# Patient Record
Sex: Male | Born: 1980 | Hispanic: No | Marital: Married | Smoking: Current every day smoker
Health system: Southern US, Community
[De-identification: ages and names within clinical notes are randomized; demographics above are authoritative.]

## PROBLEM LIST (undated history)

## (undated) DIAGNOSIS — G43909 Migraine, unspecified, not intractable, without status migrainosus: Secondary | ICD-10-CM

## (undated) DIAGNOSIS — T7840XA Allergy, unspecified, initial encounter: Secondary | ICD-10-CM

## (undated) DIAGNOSIS — F32A Depression, unspecified: Secondary | ICD-10-CM

## (undated) DIAGNOSIS — F329 Major depressive disorder, single episode, unspecified: Secondary | ICD-10-CM

## (undated) DIAGNOSIS — B019 Varicella without complication: Secondary | ICD-10-CM

## (undated) DIAGNOSIS — M109 Gout, unspecified: Secondary | ICD-10-CM

## (undated) DIAGNOSIS — F909 Attention-deficit hyperactivity disorder, unspecified type: Secondary | ICD-10-CM

## (undated) DIAGNOSIS — J45909 Unspecified asthma, uncomplicated: Secondary | ICD-10-CM

## (undated) HISTORY — DX: Attention-deficit hyperactivity disorder, unspecified type: F90.9

## (undated) HISTORY — DX: Gout, unspecified: M10.9

## (undated) HISTORY — DX: Depression, unspecified: F32.A

## (undated) HISTORY — DX: Major depressive disorder, single episode, unspecified: F32.9

## (undated) HISTORY — DX: Unspecified asthma, uncomplicated: J45.909

## (undated) HISTORY — DX: Varicella without complication: B01.9

## (undated) HISTORY — PX: WRIST SURGERY: SHX841

## (undated) HISTORY — DX: Allergy, unspecified, initial encounter: T78.40XA

## (undated) HISTORY — DX: Migraine, unspecified, not intractable, without status migrainosus: G43.909

---

## 2014-11-09 ENCOUNTER — Ambulatory Visit (INDEPENDENT_AMBULATORY_CARE_PROVIDER_SITE_OTHER): Payer: Self-pay | Admitting: Family

## 2014-11-09 ENCOUNTER — Ambulatory Visit (INDEPENDENT_AMBULATORY_CARE_PROVIDER_SITE_OTHER)
Admission: RE | Admit: 2014-11-09 | Discharge: 2014-11-09 | Disposition: A | Discharge status: Home / Self Care | Payer: Self-pay | Source: Ambulatory Visit | Attending: Family | Admitting: Family

## 2014-11-09 ENCOUNTER — Telehealth: Payer: Self-pay | Admitting: Family

## 2014-11-09 ENCOUNTER — Encounter: Payer: Self-pay | Admitting: Family

## 2014-11-09 VITALS — BP 162/96 | HR 106 | Temp 98.6°F | Resp 18 | Ht 72.0 in | Wt 217.0 lb

## 2014-11-09 DIAGNOSIS — M25571 Pain in right ankle and joints of right foot: Secondary | ICD-10-CM

## 2014-11-09 DIAGNOSIS — F32A Depression, unspecified: Secondary | ICD-10-CM

## 2014-11-09 DIAGNOSIS — M109 Gout, unspecified: Secondary | ICD-10-CM

## 2014-11-09 DIAGNOSIS — F411 Generalized anxiety disorder: Secondary | ICD-10-CM

## 2014-11-09 DIAGNOSIS — J45901 Unspecified asthma with (acute) exacerbation: Secondary | ICD-10-CM | POA: Insufficient documentation

## 2014-11-09 DIAGNOSIS — J4531 Mild persistent asthma with (acute) exacerbation: Secondary | ICD-10-CM

## 2014-11-09 DIAGNOSIS — H109 Unspecified conjunctivitis: Secondary | ICD-10-CM

## 2014-11-09 DIAGNOSIS — F329 Major depressive disorder, single episode, unspecified: Secondary | ICD-10-CM

## 2014-11-09 MED ORDER — ALBUTEROL SULFATE HFA 108 (90 BASE) MCG/ACT IN AERS: 1.0000 | INHALATION_SPRAY | Freq: Four times a day (QID) | RESPIRATORY_TRACT | Status: DC | PRN

## 2014-11-09 MED ORDER — ALPRAZOLAM 1 MG PO TABS: 1.0000 mg | ORAL_TABLET | Freq: Three times a day (TID) | ORAL | Status: DC | PRN

## 2014-11-09 MED ORDER — FUROSEMIDE 20 MG PO TABS: 20.0000 mg | ORAL_TABLET | Freq: Every day | ORAL | Status: DC | PRN

## 2014-11-09 MED ORDER — ESCITALOPRAM OXALATE 10 MG PO TABS: 10.0000 mg | ORAL_TABLET | Freq: Every day | ORAL | Status: DC

## 2014-11-09 MED ORDER — INDOMETHACIN 50 MG PO CAPS: 50.0000 mg | ORAL_CAPSULE | Freq: Three times a day (TID) | ORAL | Status: DC | PRN

## 2014-11-09 MED ORDER — ALLOPURINOL 300 MG PO TABS: 300.0000 mg | ORAL_TABLET | Freq: Two times a day (BID) | ORAL | Status: DC

## 2014-11-09 MED ORDER — OLOPATADINE HCL 0.2 % OP SOLN: 1.0000 [drp] | Freq: Every day | OPHTHALMIC | Status: AC

## 2014-11-09 MED ORDER — METHYLPREDNISOLONE ACETATE 80 MG/ML IJ SUSP
80.0000 mg | Freq: Once | INTRAMUSCULAR | Status: AC
  Administered 2014-11-09: 80 mg via INTRAMUSCULAR

## 2014-11-09 MED ORDER — FLUTICASONE-SALMETEROL 500-50 MCG/DOSE IN AEPB: 1.0000 | INHALATION_SPRAY | Freq: Two times a day (BID) | RESPIRATORY_TRACT | Status: DC

## 2014-11-09 NOTE — Telephone Encounter (Addendum)
Please inform patient that his x-rays show no evidence of fracture in his right ankle, which did not include his foot. I would like him to return just for a foot x-ray at his convenience. In the meantime, please have him continue with ice, compression, elevation and non-weightbearing.

## 2014-11-09 NOTE — Patient Instructions (Signed)
Thank you for choosing [facility].  Summary/Instructions:  Your prescription(s) have been submitted to your pharmacy or been printed and provided for you. Please take as directed and contact our office if you believe you are having problem(s) with the medication(s) or have any questions.  Please stop by radiology on the basement level of the building for your x-rays. Your results will be released to MyChart (or called to you) after review, usually within 72 hours after test completion. If any treatments or changes are necessary, you will be notified at that same time.  If your symptoms worsen or fail to improve, please contact our office for further instruction, or in case of emergency go directly to the emergency room at the closest medical facility.   Allergic Conjunctivitis The conjunctiva is a thin membrane that covers the visible white part of the eyeball and the underside of the eyelids. This membrane protects and lubricates the eye. The membrane has small blood vessels running through it that can normally be seen. When the conjunctiva becomes inflamed, the condition is called conjunctivitis. In response to the inflammation, the conjunctival blood vessels become swollen. The swelling results in redness in the normally white part of the eye. The blood vessels of this membrane also react when a person has allergies and is then called allergic conjunctivitis. This condition usually lasts for as long as the allergy persists. Allergic conjunctivitis cannot be passed to another person (non-contagious). The likelihood of bacterial infection is great and the cause is not likely due to allergies if the inflamed eye has:  A sticky discharge.  Discharge or sticking together of the lids in the morning.  Scaling or flaking of the eyelids where the eyelashes come out.  Red swollen eyelids. CAUSES   Viruses.  Irritants such as foreign bodies.  Chemicals.  General allergic  reactions.  Inflammation or serious diseases in the inside or the outside of the eye or the orbit (the boney cavity in which the eye sits) can cause a "red eye." SYMPTOMS   Eye redness.  Tearing.  Itchy eyes.  Burning feeling in the eyes.  Clear drainage from the eye.  Allergic reaction due to pollens or ragweed sensitivity. Seasonal allergic conjunctivitis is frequent in the spring when pollens are in the air and in the fall. DIAGNOSIS  This condition, in its many forms, is usually diagnosed based on the history and an ophthalmological exam. It usually involves both eyes. If your eyes react at the same time every year, allergies may be the cause. While most "red eyes" are due to allergy or an infection, the role of an eye (ophthalmological) exam is important. The exam can rule out serious diseases of the eye or orbit. TREATMENT   Non-antibiotic eye drops, ointments, or medications by mouth may be prescribed if the ophthalmologist is sure the conjunctivitis is due to allergies alone.  Over-the-counter drops and ointments for allergic symptoms should be used only after other causes of conjunctivitis have been ruled out, or as your caregiver suggests. Medications by mouth are often prescribed if other allergy-related symptoms are present. If the ophthalmologist is sure that the conjunctivitis is due to allergies alone, treatment is normally limited to drops or ointments to reduce itching and burning. HOME CARE INSTRUCTIONS   Wash hands before and after applying drops or ointments, or touching the inflamed eye(s) or eyelids.  Do not let the eye dropper tip or ointment tube touch the eyelid when putting medicine in your eye.  Stop using  your soft contact lenses and throw them away. Use a new pair of lenses when recovery is complete. You should run through sterilizing cycles at least three times before use after complete recovery if the old soft contact lenses are to be used. Hard contact  lenses should be stopped. They need to be thoroughly sterilized before use after recovery.  Itching and burning eyes due to allergies is often relieved by using a cool cloth applied to closed eye(s). SEEK MEDICAL CARE IF:   Your problems do not go away after two or three days of treatment.  Your lids are sticky (especially in the morning when you wake up) or stick together.  Discharge develops. Antibiotics may be needed either as drops, ointment, or by mouth.  You have extreme light sensitivity.  An oral temperature above 102 F (38.9 C) develops.  Pain in or around the eye or any other visual symptom develops. MAKE SURE YOU:   Understand these instructions.  Will watch your condition.  Will get help right away if you are not doing well or get worse. Document Released: 06/17/2002 Document Revised: 06/19/2011 Document Reviewed: 05/13/2007 [facility] Patient Information 2015 [place], [place]. This information is not intended to replace advice given to you by your health care provider. Make sure you discuss any questions you have with your health care provider.  Ankle Sprain An ankle sprain is an injury to the strong, fibrous tissues (ligaments) that hold the bones of your ankle joint together.  CAUSES An ankle sprain is usually caused by a fall or by twisting your ankle. Ankle sprains most commonly occur when you step on the outer edge of your foot, and your ankle turns inward. People who participate in sports are more prone to these types of injuries.  SYMPTOMS   Pain in your ankle. The pain may be present at rest or only when you are trying to stand or walk.  Swelling.  Bruising. Bruising may develop immediately or within 1 to 2 days after your injury.  Difficulty standing or walking, particularly when turning corners or changing directions. DIAGNOSIS  Your caregiver will ask you details about your injury and perform a physical exam of your ankle to determine if you have an  ankle sprain. During the physical exam, your caregiver will press on and apply pressure to specific areas of your foot and ankle. Your caregiver will try to move your ankle in certain ways. An X-ray exam may be done to be sure a bone was not broken or a ligament did not separate from one of the bones in your ankle (avulsion fracture).  TREATMENT  Certain types of braces can help stabilize your ankle. Your caregiver can make a recommendation for this. Your caregiver may recommend the use of medicine for pain. If your sprain is severe, your caregiver may refer you to a surgeon who helps to restore function to parts of your skeletal system (orthopedist) or a physical therapist. HOME CARE INSTRUCTIONS   Apply ice to your injury for 1-2 days or as directed by your caregiver. Applying ice helps to reduce inflammation and pain.  Put ice in a plastic bag.  Place a towel between your skin and the bag.  Leave the ice on for 15-20 minutes at a time, every 2 hours while you are awake.  Only take over-the-counter or prescription medicines for pain, discomfort, or fever as directed by your caregiver.  Elevate your injured ankle above the level of your heart as much as possible for  2-3 days.  If your caregiver recommends crutches, use them as instructed. Gradually put weight on the affected ankle. Continue to use crutches or a cane until you can walk without feeling pain in your ankle.  If you have a plaster splint, wear the splint as directed by your caregiver. Do not rest it on anything harder than a pillow for the first 24 hours. Do not put weight on it. Do not get it wet. You may take it off to take a shower or bath.  You may have been given an elastic bandage to wear around your ankle to provide support. If the elastic bandage is too tight (you have numbness or tingling in your foot or your foot becomes cold and blue), adjust the bandage to make it comfortable.  If you have an air splint, you may blow  more air into it or let air out to make it more comfortable. You may take your splint off at night and before taking a shower or bath. Wiggle your toes in the splint several times per day to decrease swelling. SEEK MEDICAL CARE IF:   You have rapidly increasing bruising or swelling.  Your toes feel extremely cold or you lose feeling in your foot.  Your pain is not relieved with medicine. SEEK IMMEDIATE MEDICAL CARE IF:  Your toes are numb or blue.  You have severe pain that is increasing. MAKE SURE YOU:   Understand these instructions.  Will watch your condition.  Will get help right away if you are not doing well or get worse. Document Released: 03/27/2005 Document Revised: 12/20/2011 Document Reviewed: 04/08/2011 [facility] Patient Information 2015 [place], [place]. This information is not intended to replace advice given to you by your health care provider. Make sure you discuss any questions you have with your health care provider.

## 2014-11-09 NOTE — Progress Notes (Signed)
Subjective:    Patient ID: Andrew Leonard, male    DOB: [birth year], 34 y.o.   MRN: [id number]  Chief Complaint  Patient presents with  . Establish Care    eyes are bothering him, allergies are bothering him, right ankle pain and swollen    HPI:  Geoff Dacanay is a 34 y.o. male with a PMH of seasonal allergies, asthma, depression, gout, ADHD, chickenpox, and migraines who presents today for an office visit to establish care.    1.) Eyes - Associated symptom of eye redness has been going on since this morning. Is allergic to cats and most recently had his mother-in-law staying with them who has brought her cats. Eye discharge is present and described as clear to yellow. Modifying factors include flonase which did not help.  2.) Right ankle - Associated symptom of pain and swelling located in his right ankle has been going on for about 1 day when he was working out in the yard when he fell. Unsure of any sounds or sensations heard or felt. Modifying factors include ice and OTC anti-inflammatories which seemed to help.  Allergies  Allergen Reactions  . Tramadol Hives  . Uloric [Febuxostat] Swelling     No outpatient prescriptions prior to visit.   No facility-administered medications prior to visit.     Past Medical History  Diagnosis Date  . Gout   . Asthma   . Allergy   . Depression   . ADHD (attention deficit hyperactivity disorder)   . Chicken pox   . Migraines      Past Surgical History  Procedure Laterality Date  . Wrist surgery       Family History  Problem Relation Age of Onset  . Arthritis Father   . Esophageal cancer Maternal Grandfather   . Breast cancer Paternal Grandmother   . Colon cancer Paternal Grandfather      History   Social History  . Marital Status: Married    Spouse Name: N/A  . Number of Children: 1  . Years of Education: 14   Occupational History  . Welder    Social History Main Topics  . Smoking status: Current Every  Day Smoker -- 0.50 packs/day  . Smokeless tobacco: Never Used  . Alcohol Use: No  . Drug Use: No  . Sexual Activity: Not on file   Other Topics Concern  . Not on file   Social History Narrative   Wife has MS; allergic to cats    Review of Systems  Constitutional: Negative for fever and chills.  Eyes: Positive for discharge, redness and itching.  Musculoskeletal:       Positive for ankle swelling.       Objective:    BP 162/96 mmHg  Pulse 106  Temp(Src) 98.6 F (37 C) (Oral)  Resp 18  Ht 6' (1.829 m)  Wt 217 lb (98.431 kg)  BMI 29.42 kg/m2  SpO2 99% Nursing note and vital signs reviewed.  Physical Exam  Constitutional: He is oriented to person, place, and time. He appears well-developed and well-nourished. No distress.  Eyes: Lids are everted and swept, no foreign bodies found. Right eye exhibits discharge. Left eye exhibits discharge. Right conjunctiva is injected. Left conjunctiva is injected.  Stringy discharge noted.   Cardiovascular: Normal rate, regular rhythm, normal heart sounds and intact distal pulses.   Pulmonary/Chest: Effort normal and breath sounds normal.  Musculoskeletal:  Right ankle: No obvious deformity noted moderate to severe amount of diffuse swelling noted.  Palpable tenderness along the base of fifth metatarsal and dorsal aspect of his foot. Decreased range of motion secondary to edema and pain. Unable to complete ligamentous testing secondary to pain and swelling.   Neurological: He is alert and oriented to person, place, and time.  Skin: Skin is warm and dry.  Psychiatric: He has a normal mood and affect. His behavior is normal. Judgment and thought content normal.       Assessment & Plan:   Problem List Items Addressed This Visit      Respiratory   Asthma with acute exacerbation   Relevant Medications   predniSONE (DELTASONE) 10 MG tablet   Fluticasone-Salmeterol (ADVAIR) 500-50 MCG/DOSE AEPB   albuterol (PROVENTIL HFA;VENTOLIN HFA) 108  (90 BASE) MCG/ACT inhaler   methylPREDNISolone acetate (DEPO-MEDROL) injection 80 mg (Completed)     Other   Right ankle pain - Primary    Right ankle exam consistent with possible fracture. Obtain ankle x-ray to rule out fracture. Treat conservatively with compression, elevation and ice. Recommend crutches to decrease weight bearing. Follow up treatment pending x-ray results.       Relevant Orders   DG Ankle Complete Right (Completed)   Conjunctivitis    Symptoms and exam consistent with allergic conjunctivitis. Start OTC Zyrtec daily. Start pataday for eyes. Follow up if symptoms worsen or do not improve with treatment regimen.       Relevant Medications   Olopatadine HCl (PATADAY) 0.2 % SOLN   Depression   Relevant Medications   escitalopram (LEXAPRO) 10 MG tablet   ALPRAZolam (XANAX) 1 MG tablet   Generalized anxiety disorder   Relevant Medications   ALPRAZolam (XANAX) 1 MG tablet   Gout   Relevant Medications   indomethacin (INDOCIN) 50 MG capsule   allopurinol (ZYLOPRIM) 300 MG tablet

## 2014-11-09 NOTE — Assessment & Plan Note (Signed)
Right ankle exam consistent with possible fracture. Obtain ankle x-ray to rule out fracture. Treat conservatively with compression, elevation and ice. Recommend crutches to decrease weight bearing. Follow up treatment pending x-ray results.

## 2014-11-09 NOTE — Progress Notes (Signed)
Pre visit review using our clinic review tool, if applicable. No additional management support is needed unless otherwise documented below in the visit note. 

## 2014-11-09 NOTE — Assessment & Plan Note (Signed)
Symptoms and exam consistent with allergic conjunctivitis. Start OTC Zyrtec daily. Start pataday for eyes. Follow up if symptoms worsen or do not improve with treatment regimen.

## 2014-11-11 NOTE — Telephone Encounter (Signed)
LVM for pt to call back as soon as possible.   

## 2014-11-13 NOTE — Telephone Encounter (Signed)
LVM for pt to call back.

## 2014-11-17 NOTE — Telephone Encounter (Signed)
Letter sent with the results below.

## 2014-12-15 ENCOUNTER — Other Ambulatory Visit: Payer: Self-pay | Admitting: Family

## 2014-12-15 DIAGNOSIS — F329 Major depressive disorder, single episode, unspecified: Secondary | ICD-10-CM

## 2014-12-15 DIAGNOSIS — F32A Depression, unspecified: Secondary | ICD-10-CM

## 2014-12-15 DIAGNOSIS — M109 Gout, unspecified: Secondary | ICD-10-CM

## 2014-12-15 DIAGNOSIS — F411 Generalized anxiety disorder: Secondary | ICD-10-CM

## 2014-12-15 MED ORDER — ALLOPURINOL 300 MG PO TABS: 300.0000 mg | ORAL_TABLET | Freq: Two times a day (BID) | ORAL | Status: AC

## 2014-12-15 MED ORDER — INDOMETHACIN 50 MG PO CAPS: 50.0000 mg | ORAL_CAPSULE | Freq: Three times a day (TID) | ORAL | Status: AC | PRN

## 2014-12-15 MED ORDER — ALPRAZOLAM 1 MG PO TABS: 1.0000 mg | ORAL_TABLET | Freq: Three times a day (TID) | ORAL | Status: DC | PRN

## 2014-12-15 MED ORDER — ESCITALOPRAM OXALATE 10 MG PO TABS: 10.0000 mg | ORAL_TABLET | Freq: Every day | ORAL | Status: AC

## 2014-12-15 MED ORDER — FUROSEMIDE 20 MG PO TABS: 20.0000 mg | ORAL_TABLET | Freq: Every day | ORAL | Status: AC | PRN

## 2014-12-15 MED ORDER — FLUTICASONE-SALMETEROL 500-50 MCG/DOSE IN AEPB: 1.0000 | INHALATION_SPRAY | Freq: Two times a day (BID) | RESPIRATORY_TRACT | Status: AC

## 2014-12-15 NOTE — Telephone Encounter (Signed)
Patient is calling to follow up for ALPRAZolam Prudy Feeler) 1 MG tablet [[id number]]. He states that [facility] did not receive xanax script. He states that he took his last pill today and is totally out. Please give him a call to advise that it has been taken care of.

## 2014-12-15 NOTE — Telephone Encounter (Signed)
Pt called stating [facility] sent in refill request for his medication last week and they haven't heard back from them. He is totally out of his Xanax  allopurinol (ZYLOPRIM) 300 MG tablet [[id number] ALPRAZolam (XANAX) 1 MG tablet [[id number] escitalopram (LEXAPRO) 10 MG tablet [[id number] Fluticasone-Salmeterol (ADVAIR) 500-50 MCG/DOSE AEPB [[id number]]  furosemide (LASIX) 20 MG tablet [[id number] indomethacin (INDOCIN) 50 MG capsule [[id number] This may be all of them Pt can be reached at [phone]

## 2014-12-15 NOTE — Telephone Encounter (Signed)
Please advise on refill for xanax all other medications have been sent to pharmacy

## 2014-12-21 ENCOUNTER — Telehealth: Payer: Self-pay | Admitting: Emergency Medicine

## 2014-12-30 ENCOUNTER — Other Ambulatory Visit: Payer: Self-pay

## 2014-12-30 DIAGNOSIS — J4531 Mild persistent asthma with (acute) exacerbation: Secondary | ICD-10-CM

## 2014-12-30 MED ORDER — ALBUTEROL SULFATE HFA 108 (90 BASE) MCG/ACT IN AERS: 1.0000 | INHALATION_SPRAY | Freq: Four times a day (QID) | RESPIRATORY_TRACT | Status: AC | PRN

## 2015-01-13 ENCOUNTER — Telehealth: Payer: Self-pay | Admitting: Family

## 2015-01-13 DIAGNOSIS — F411 Generalized anxiety disorder: Secondary | ICD-10-CM

## 2015-01-13 MED ORDER — ALPRAZOLAM 1 MG PO TABS: 1.0000 mg | ORAL_TABLET | Freq: Three times a day (TID) | ORAL | Status: DC | PRN

## 2015-01-13 NOTE — Telephone Encounter (Signed)
Advised patients wife that rx request was never received, but calling in rx to [facility]

## 2015-01-13 NOTE — Telephone Encounter (Signed)
Pt request refill for Xanax to be send [facility], pt is out of this med please advise. Pt stated that pharmacy tried to request this for 3 days but never heard anything from our office.

## 2015-01-18 ENCOUNTER — Telehealth: Payer: Self-pay | Admitting: Family

## 2015-01-18 DIAGNOSIS — F411 Generalized anxiety disorder: Secondary | ICD-10-CM

## 2015-01-18 MED ORDER — ALPRAZOLAM 1 MG PO TABS: 1.0000 mg | ORAL_TABLET | Freq: Three times a day (TID) | ORAL | Status: AC | PRN

## 2015-01-18 NOTE — Telephone Encounter (Signed)
He was just provided a 10 day prescription on 10/5 and was filled on 10/6 which means he should have enough until 10/16. I will refill 1 month but will be dated for 10/16.

## 2015-01-18 NOTE — Telephone Encounter (Signed)
Patient is requesting script for alprazolam to be sent to [facility] or he can pick up today while patient is in [place].

## 2015-01-18 NOTE — Telephone Encounter (Signed)
PLS [facility].../LMB

## 2015-01-18 NOTE — Telephone Encounter (Signed)
Refill faxed to [facility]...Raechel Chute

## 2015-01-29 ENCOUNTER — Inpatient Hospital Stay: Payer: Self-pay | Admitting: Family

## 2016-01-16 IMAGING — CR DG ANKLE COMPLETE 3+V*R*
3 series · 3 of 3 positions shown · non-contrast
Comparison: None.

CLINICAL DATA: 34-year-old male slipped and fell yesterday with
pain and swelling right ankle. History of prior ankle fracture.
Initial encounter.

EXAM:
RIGHT ANKLE - COMPLETE 3+ VIEW

[view not recorded (1 of 3)]
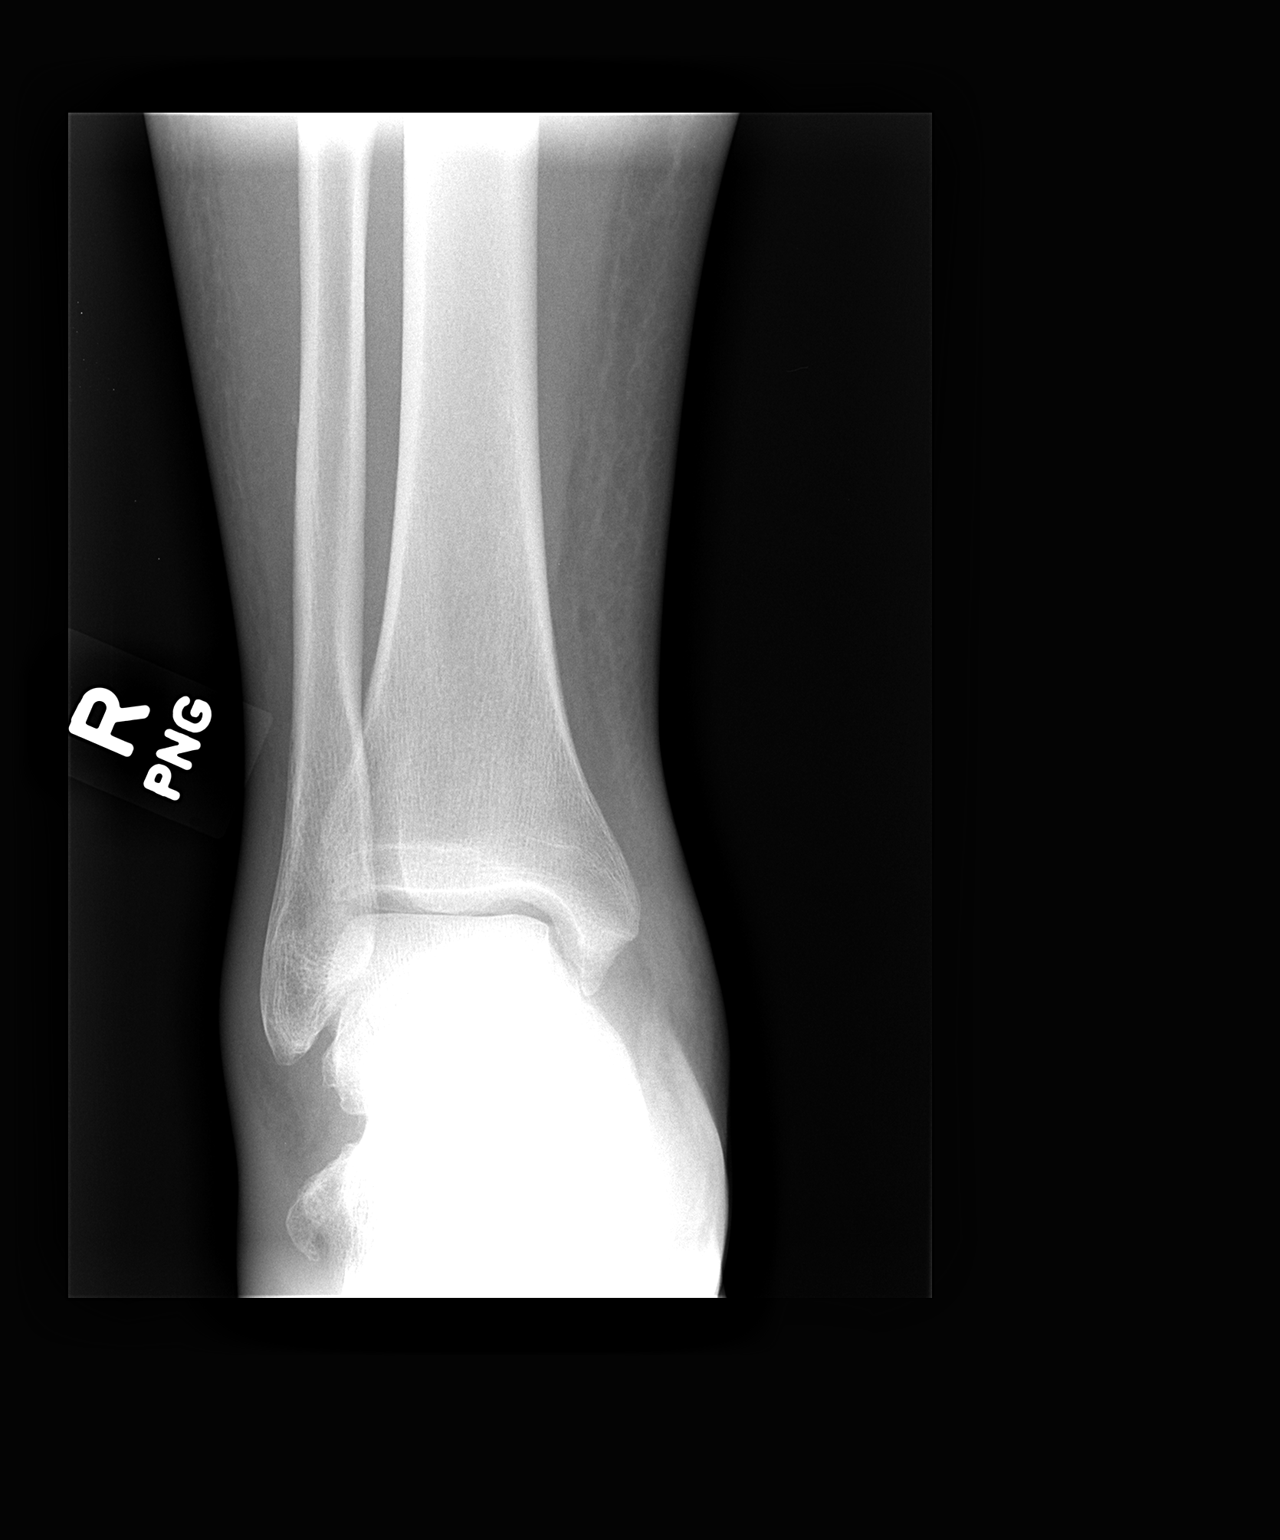

[view not recorded (2 of 3)]
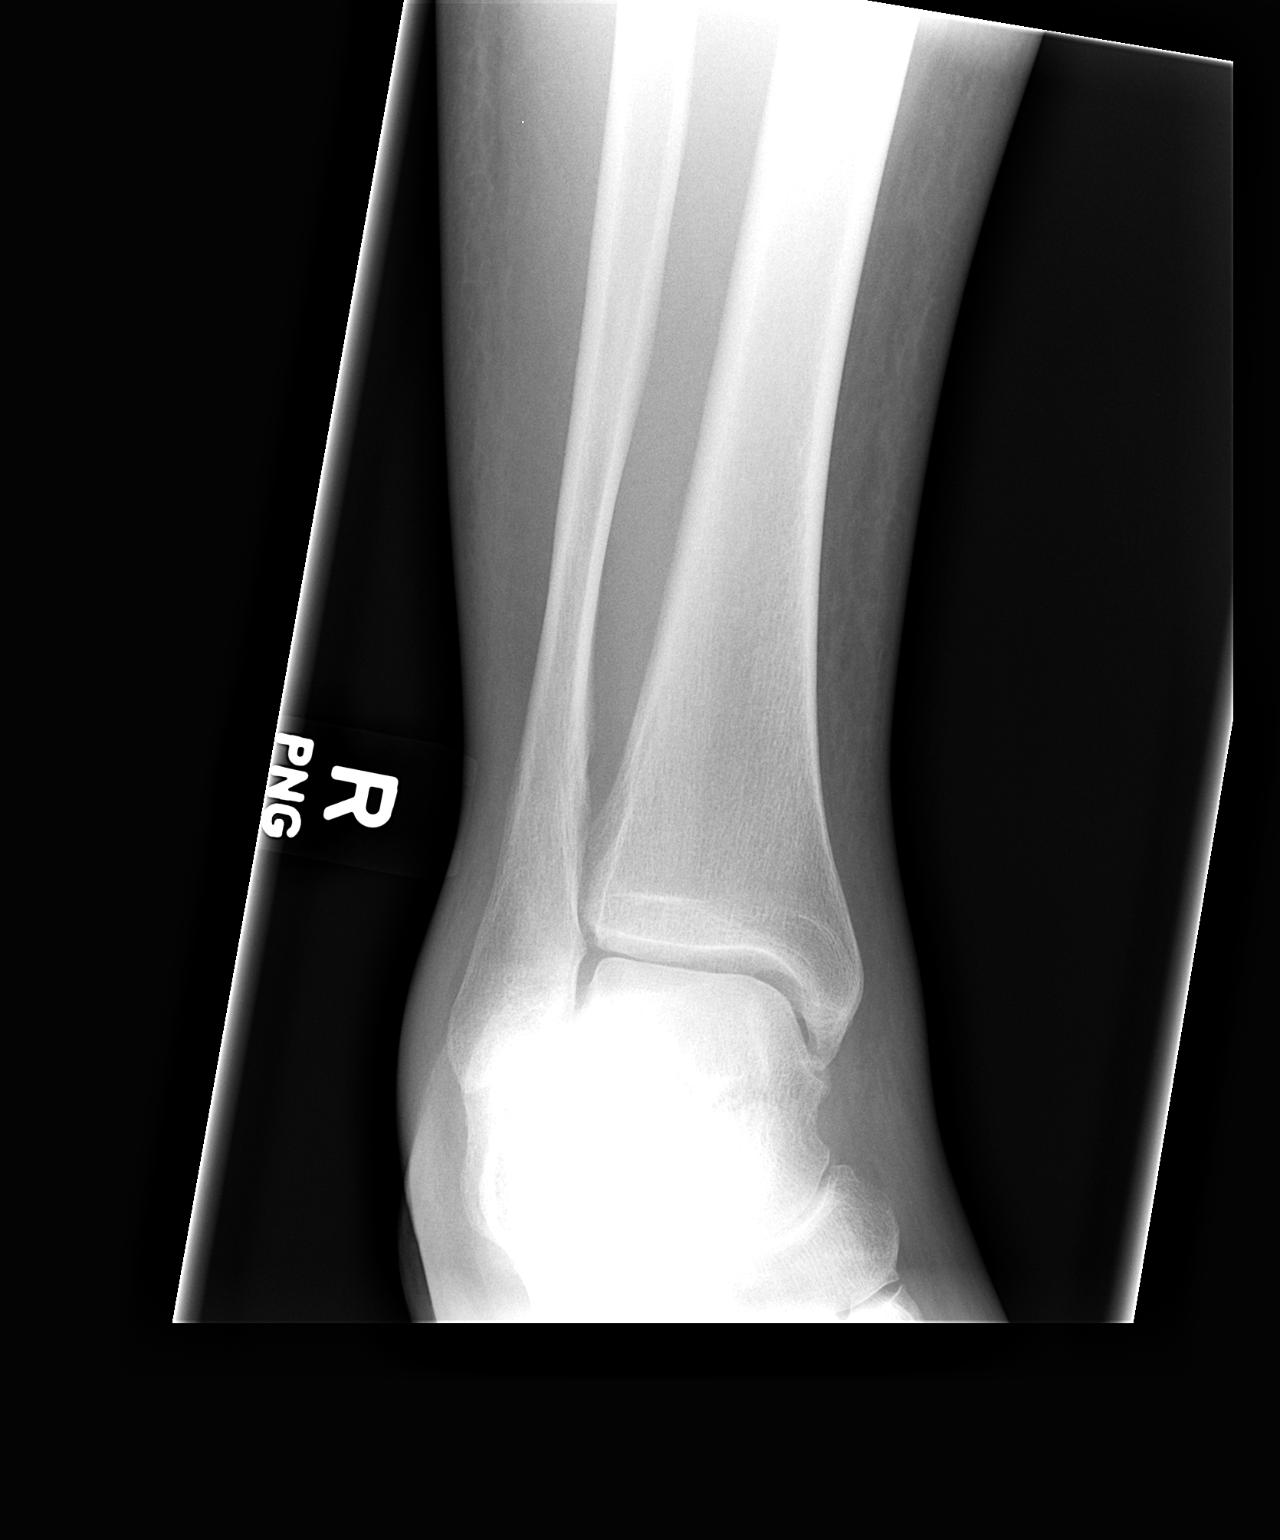

[view not recorded (3 of 3)]
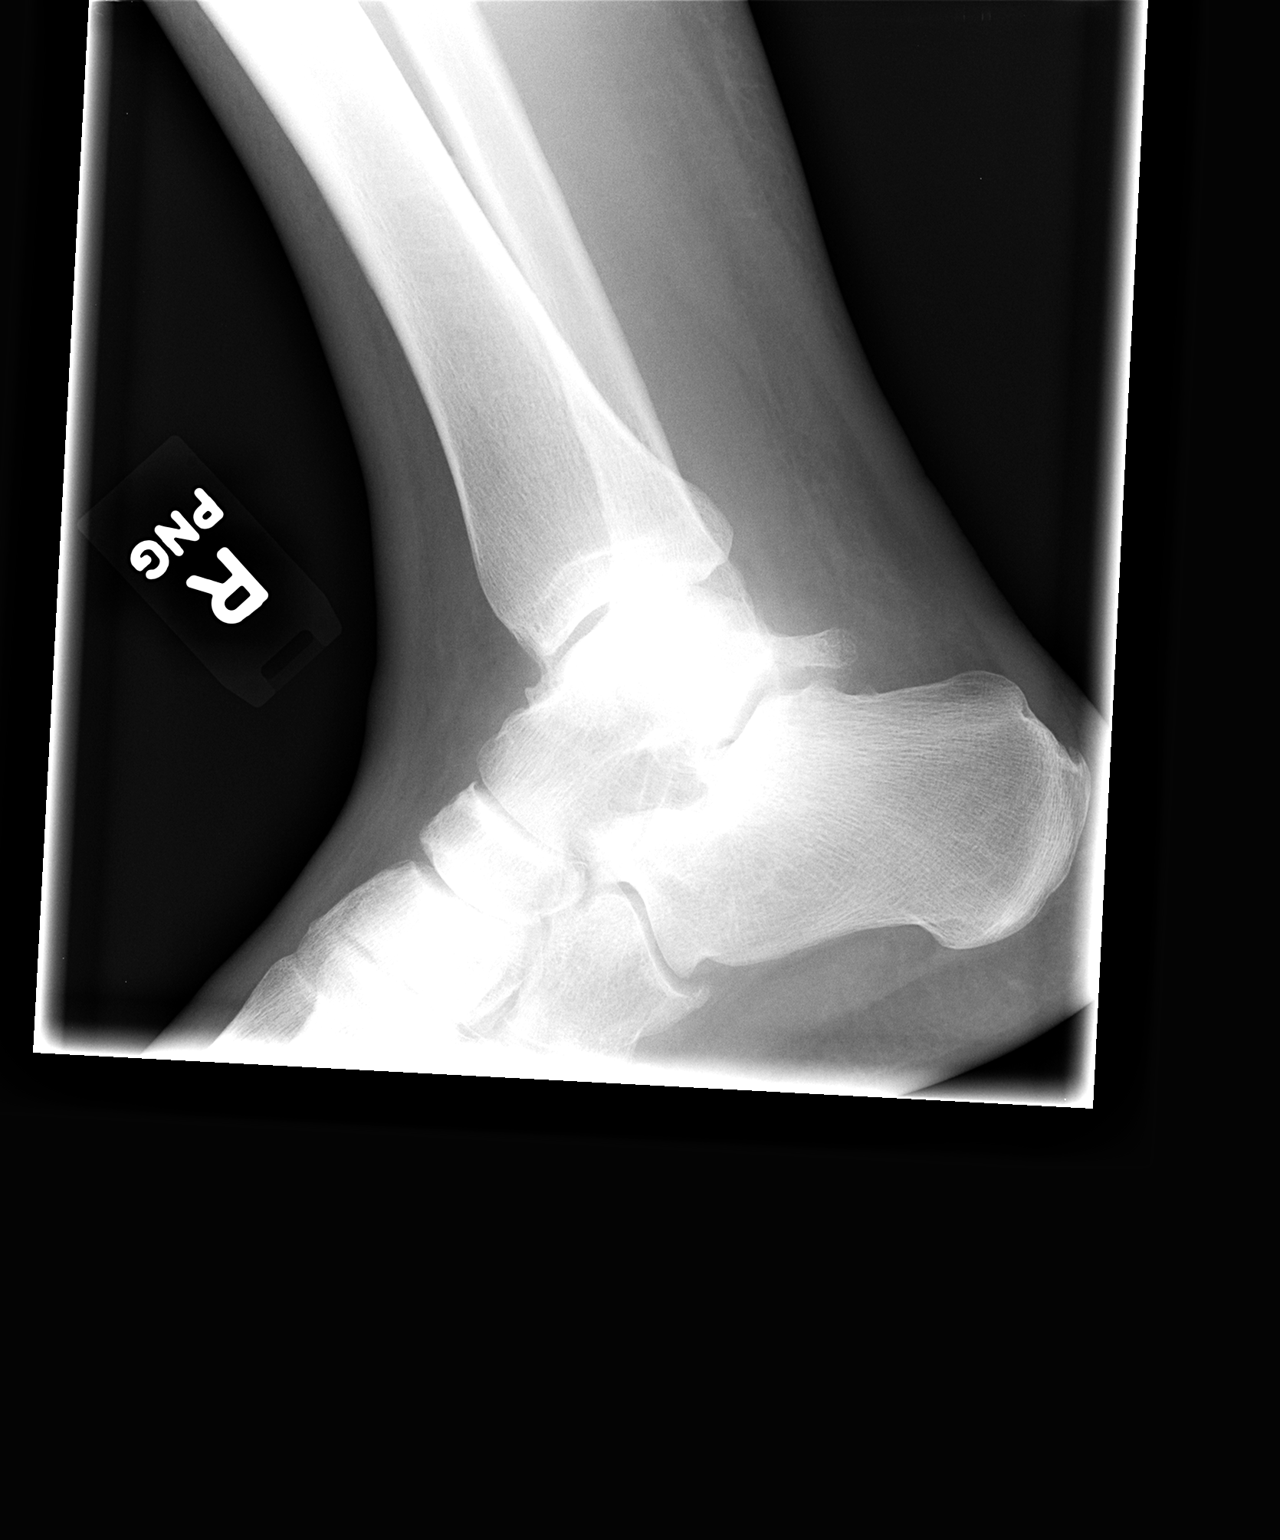

[3 of 3 positions shown; findings below may reference images not displayed]

FINDINGS: No fracture or dislocation.

Medial malleolar soft tissue swelling may indicate soft tissue
injury.
IMPRESSION: No fracture or dislocation.

Medial malleolar soft tissue swelling may indicate soft tissue
injury.
